# Patient Record
Sex: Female | Born: 1985 | Race: White | Hispanic: No | Marital: Single | Smoking: Current every day smoker
Health system: Southern US, Community
[De-identification: ages and names within clinical notes are randomized; demographics above are authoritative.]

## PROBLEM LIST (undated history)

## (undated) DIAGNOSIS — J069 Acute upper respiratory infection, unspecified: Secondary | ICD-10-CM

## (undated) DIAGNOSIS — J45909 Unspecified asthma, uncomplicated: Secondary | ICD-10-CM

## (undated) HISTORY — DX: Acute upper respiratory infection, unspecified: J06.9

---
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
## 2005-01-11 ENCOUNTER — Encounter (INDEPENDENT_AMBULATORY_CARE_PROVIDER_SITE_OTHER): Payer: Self-pay | Admitting: Internal Medicine

## 2005-01-11 ENCOUNTER — Emergency Department (HOSPITAL_COMMUNITY): Admission: EM | Admit: 2005-01-11 | Discharge: 2005-01-11 | Payer: Self-pay | Admitting: Family Medicine

## 2005-01-11 LAB — CONVERTED CEMR LAB: Pap Smear: NORMAL

## 2005-06-15 ENCOUNTER — Emergency Department (HOSPITAL_COMMUNITY): Admission: EM | Admit: 2005-06-15 | Discharge: 2005-06-15 | Payer: Self-pay | Admitting: Emergency Medicine

## 2005-06-16 ENCOUNTER — Ambulatory Visit: Payer: Self-pay | Admitting: Internal Medicine

## 2006-02-15 ENCOUNTER — Encounter: Payer: Self-pay | Admitting: Internal Medicine

## 2006-02-15 DIAGNOSIS — J45909 Unspecified asthma, uncomplicated: Secondary | ICD-10-CM | POA: Insufficient documentation

## 2006-02-15 DIAGNOSIS — G43909 Migraine, unspecified, not intractable, without status migrainosus: Secondary | ICD-10-CM | POA: Insufficient documentation

## 2007-06-22 ENCOUNTER — Encounter: Admission: RE | Admit: 2007-06-22 | Discharge: 2007-06-22 | Payer: Self-pay | Admitting: Internal Medicine

## 2008-06-13 ENCOUNTER — Emergency Department (HOSPITAL_COMMUNITY): Admission: EM | Admit: 2008-06-13 | Discharge: 2008-06-13 | Payer: Self-pay | Admitting: Emergency Medicine

## 2011-10-20 ENCOUNTER — Emergency Department (HOSPITAL_COMMUNITY)
Admission: EM | Admit: 2011-10-20 | Discharge: 2011-10-20 | Disposition: A | Discharge status: Home / Self Care | Payer: Self-pay | Attending: Emergency Medicine | Admitting: Emergency Medicine

## 2011-10-20 ENCOUNTER — Encounter (HOSPITAL_COMMUNITY): Payer: Self-pay | Admitting: Emergency Medicine

## 2011-10-20 DIAGNOSIS — J45909 Unspecified asthma, uncomplicated: Secondary | ICD-10-CM | POA: Insufficient documentation

## 2011-10-20 DIAGNOSIS — F17200 Nicotine dependence, unspecified, uncomplicated: Secondary | ICD-10-CM | POA: Insufficient documentation

## 2011-10-20 DIAGNOSIS — J029 Acute pharyngitis, unspecified: Secondary | ICD-10-CM | POA: Insufficient documentation

## 2011-10-20 HISTORY — DX: Unspecified asthma, uncomplicated: J45.909

## 2011-10-20 LAB — RAPID STREP SCREEN (MED CTR MEBANE ONLY): Streptococcus, Group A Screen (Direct): NEGATIVE

## 2011-10-20 MED ORDER — PREDNISONE 20 MG PO TABS: 40.0000 mg | ORAL_TABLET | Freq: Every day | ORAL | Status: DC

## 2011-10-20 NOTE — ED Notes (Signed)
Pt reports pain on swallowing, difficulty swallowing x 6 hrs

## 2011-10-20 NOTE — ED Provider Notes (Signed)
History     CSN: [id number]  Arrival date & time 10/20/11  1438   First MD Initiated Contact with Patient 10/20/11 1515      Chief Complaint  Patient presents with  . Sore Throat    6 hr hx of sore throat    (Consider location/radiation/quality/duration/timing/severity/associated sxs/prior treatment) Patient is a 26 y.o. female presenting with pharyngitis. The history is provided by the patient.  Sore Throat This is a new problem. The current episode started today. The problem occurs constantly. The problem has been unchanged. Associated symptoms include a sore throat and swollen glands. Pertinent negatives include no abdominal pain, anorexia, arthralgias, change in bowel habit, chest pain, chills, congestion, coughing, diaphoresis, fatigue, fever, headaches, joint swelling, myalgias, nausea, neck pain, numbness, rash, urinary symptoms, vertigo, visual change, vomiting or weakness. The symptoms are aggravated by swallowing. She has tried nothing for the symptoms.    Past Medical History  Diagnosis Date  . Asthma     History reviewed. No pertinent past surgical history.  History reviewed. No pertinent family history.  History  Substance Use Topics  . Smoking status: Current Every Day Smoker    Types: Cigarettes  . Smokeless tobacco: Not on file  . Alcohol Use: Yes    OB History    Grav Para Term Preterm Abortions TAB SAB Ect Mult Living                  Review of Systems  Constitutional: Negative for fever, chills, diaphoresis, activity change, appetite change and fatigue.  HENT: Positive for sore throat and trouble swallowing. Negative for congestion, facial swelling, rhinorrhea, sneezing, drooling, neck pain, neck stiffness, dental problem, voice change, postnasal drip and sinus pressure.   Eyes: Negative for visual disturbance.  Respiratory: Negative for cough, choking, shortness of breath, wheezing and stridor.   Cardiovascular: Negative for chest pain.    Gastrointestinal: Negative for nausea, vomiting, abdominal pain, anorexia and change in bowel habit.  Musculoskeletal: Negative for myalgias, joint swelling and arthralgias.  Skin: Negative for rash.  Neurological: Negative for dizziness, vertigo, weakness, numbness and headaches.  Hematological: Positive for adenopathy. Does not bruise/bleed easily.  All other systems reviewed and are negative.    Allergies  Review of patient's allergies indicates no known allergies.  Home Medications   Current Outpatient Rx  Name Route Sig Dispense Refill  . NORETHIN ACE-ETH ESTRAD-FE 1.5-30 MG-MCG PO TABS Oral Take 1 tablet by mouth daily.      BP 110/66  Pulse 75  Temp 98.6 F (37 C) (Oral)  Resp 18  SpO2 98%  LMP 09/29/2011  Physical Exam  Nursing note and vitals reviewed. Constitutional: She is oriented to person, place, and time. She appears well-developed and well-nourished. No distress.  HENT:  Head: Normocephalic and atraumatic. No trismus in the jaw.  Right Ear: Tympanic membrane, external ear and ear canal normal.  Left Ear: Tympanic membrane, external ear and ear canal normal.  Nose: Nose normal. No rhinorrhea. Right sinus exhibits no maxillary sinus tenderness and no frontal sinus tenderness. Left sinus exhibits no maxillary sinus tenderness and no frontal sinus tenderness.  Mouth/Throat: Uvula is midline and mucous membranes are normal. Normal dentition. No dental abscesses or uvula swelling. Oropharyngeal exudate and posterior oropharyngeal edema present. No posterior oropharyngeal erythema or tonsillar abscesses.       No submental edema, tongue not elevated, no trismus. No impending airway obstruction; Pt able to speak full sentences, swallow intact, no drooling, stridor, or tonsillar/uvula displacement.  No palatal petechia  Eyes: Conjunctivae normal are normal.  Neck: Trachea normal, normal range of motion and full passive range of motion without pain. Neck supple. No  rigidity. Normal range of motion present. No Brudzinski's sign noted.       Flexion and extension of neck without pain or difficulty. Able to breath without difficulty in extension.  Cardiovascular: Normal rate and regular rhythm.   Pulmonary/Chest: Effort normal and breath sounds normal. No stridor. No respiratory distress. She has no wheezes.  Abdominal: Soft. There is no tenderness.       No obvious evidence of splenomegaly. Non ttp.   Musculoskeletal: Normal range of motion.  Lymphadenopathy:       Head (right side): No preauricular and no posterior auricular adenopathy present.       Head (left side): No preauricular and no posterior auricular adenopathy present.    She has cervical adenopathy.  Neurological: She is alert and oriented to person, place, and time.  Skin: Skin is warm and dry. No rash noted. She is not diaphoretic.  Psychiatric: She has a normal mood and affect.    ED Course  Procedures (including critical care time)   Labs Reviewed  RAPID STREP SCREEN   No results found.   No diagnosis found.    MDM  Pt afebrile with minmal tonsillar exudate & negative strep. Presents with mild cervical lymphadenopathy, & dysphagia; diagnosis of viral pharyngitis. No abx indicated. DC w symptomatic tx for pain  Pt does not appear dehydrated, but did discuss importance of water rehydration. Presentation non concerning for PTA or infxn spread to soft tissue. No trismus or uvula deviation. Specific return precautions discussed. Pt able to drink water in ED without difficulty with intact air way. Recommended PCP follow up.         Jaci Carrel, [place] 10/20/11 [phone]

## 2011-10-20 NOTE — ED Provider Notes (Signed)
Medical screening examination/treatment/procedure(s) were performed by non-physician practitioner and as supervising physician I was immediately available for consultation/collaboration.   Shelda Jakes, MD 10/20/11 [phone]

## 2013-08-16 ENCOUNTER — Encounter (HOSPITAL_COMMUNITY): Payer: Self-pay | Admitting: Emergency Medicine

## 2013-08-16 ENCOUNTER — Emergency Department (HOSPITAL_COMMUNITY)
Admission: EM | Admit: 2013-08-16 | Discharge: 2013-08-16 | Disposition: A | Discharge status: Home / Self Care | Payer: Managed Care, Other (non HMO) | Attending: Emergency Medicine | Admitting: Emergency Medicine

## 2013-08-16 ENCOUNTER — Other Ambulatory Visit: Payer: Self-pay

## 2013-08-16 DIAGNOSIS — F17200 Nicotine dependence, unspecified, uncomplicated: Secondary | ICD-10-CM | POA: Insufficient documentation

## 2013-08-16 DIAGNOSIS — W1809XA Striking against other object with subsequent fall, initial encounter: Secondary | ICD-10-CM | POA: Insufficient documentation

## 2013-08-16 DIAGNOSIS — Y9389 Activity, other specified: Secondary | ICD-10-CM | POA: Insufficient documentation

## 2013-08-16 DIAGNOSIS — Z79899 Other long term (current) drug therapy: Secondary | ICD-10-CM | POA: Insufficient documentation

## 2013-08-16 DIAGNOSIS — Y9289 Other specified places as the place of occurrence of the external cause: Secondary | ICD-10-CM | POA: Insufficient documentation

## 2013-08-16 DIAGNOSIS — J45909 Unspecified asthma, uncomplicated: Secondary | ICD-10-CM | POA: Insufficient documentation

## 2013-08-16 DIAGNOSIS — R55 Syncope and collapse: Secondary | ICD-10-CM | POA: Insufficient documentation

## 2013-08-16 DIAGNOSIS — Z043 Encounter for examination and observation following other accident: Secondary | ICD-10-CM | POA: Insufficient documentation

## 2013-08-16 DIAGNOSIS — IMO0002 Reserved for concepts with insufficient information to code with codable children: Secondary | ICD-10-CM | POA: Insufficient documentation

## 2013-08-16 LAB — CBC WITH DIFFERENTIAL/PLATELET
BASOS PCT: 0 % (ref 0–1)
Basophils Absolute: 0 10*3/uL (ref 0.0–0.1)
EOS ABS: 1.4 10*3/uL — AB (ref 0.0–0.7)
Eosinophils Relative: 13 % — ABNORMAL HIGH (ref 0–5)
HEMATOCRIT: 38.5 % (ref 36.0–46.0)
HEMOGLOBIN: 13.1 g/dL (ref 12.0–15.0)
Lymphocytes Relative: 30 % (ref 12–46)
Lymphs Abs: 3.2 10*3/uL (ref 0.7–4.0)
MCH: 31.1 pg (ref 26.0–34.0)
MCHC: 34 g/dL (ref 30.0–36.0)
MCV: 91.4 fL (ref 78.0–100.0)
MONO ABS: 1.1 10*3/uL — AB (ref 0.1–1.0)
MONOS PCT: 10 % (ref 3–12)
NEUTROS PCT: 47 % (ref 43–77)
Neutro Abs: 5.2 10*3/uL (ref 1.7–7.7)
Platelets: 289 10*3/uL (ref 150–400)
RBC: 4.21 MIL/uL (ref 3.87–5.11)
RDW: 12.7 % (ref 11.5–15.5)
WBC: 10.8 10*3/uL — ABNORMAL HIGH (ref 4.0–10.5)

## 2013-08-16 LAB — BASIC METABOLIC PANEL
Anion gap: 14 (ref 5–15)
BUN: 11 mg/dL (ref 6–23)
CO2: 23 mEq/L (ref 19–32)
CREATININE: 0.52 mg/dL (ref 0.50–1.10)
Calcium: 8.9 mg/dL (ref 8.4–10.5)
Chloride: 103 mEq/L (ref 96–112)
Glucose, Bld: 94 mg/dL (ref 70–99)
Potassium: 4.1 mEq/L (ref 3.7–5.3)
Sodium: 140 mEq/L (ref 137–147)

## 2013-08-16 NOTE — ED Provider Notes (Signed)
CSN: [id number]     Arrival date & time 08/16/13  0023 History   First MD Initiated Contact with Patient 08/16/13 [phone]     Chief Complaint  Patient presents with  . Fall     (Consider location/radiation/quality/duration/timing/severity/associated sxs/prior Treatment) HPI Comments: 28 year old female, history of intermittent syncopal episodes since she was a child presents with a recurrent syncopal episode. She states that her prodromal symptoms include a buzzing in her ear followed by a syncopal episode, there is no palpitations or chest pain, no focal weakness or numbness. Today she fell into a wall striking the left temporoparietal scalp and then collapsing to the ground. This was not witnessed, she believes that her syncopal episode lasted approximately 1 minute followed by complete resolution of her symptoms. At this time she is symptom-free. She denies any recent fevers chills coughing shortness of breath chest pain abdominal pain back pain rashes or swelling has no dysuria or diarrhea. She is currently having her menstrual cycle, denies any bleeding or history of anemia. She doesn't worse having a full workup for syncopal episodes as a child which she states was normal. At this time she has no complaints  Patient is a 28 y.o. female presenting with fall. The history is provided by the patient.  Fall    Past Medical History  Diagnosis Date  . Asthma    History reviewed. No pertinent past surgical history. No family history on file. History  Substance Use Topics  . Smoking status: Current Every Day Smoker    Types: Cigarettes  . Smokeless tobacco: Not on file  . Alcohol Use: Yes   OB History   Grav Para Term Preterm Abortions TAB SAB Ect Mult Living                 Review of Systems  All other systems reviewed and are negative.     Allergies  Review of patient's allergies indicates no known allergies.  Home Medications   Prior to Admission medications   Medication  Sig Start Date End Date Taking? Authorizing Provider  norethindrone-ethinyl estradiol-iron (MICROGESTIN FE,GILDESS FE,LOESTRIN FE) 1.5-30 MG-MCG tablet Take 1 tablet by mouth daily.    Historical Provider, MD  predniSONE (DELTASONE) 20 MG tablet Take 2 tablets (40 mg total) by mouth daily. 10/20/11   Lisette Paz, PA-C   BP 104/53  Pulse 85  Temp(Src) 98.3 F (36.8 C)  Resp 16  Ht 5\' 5"  (1.651 m)  Wt 145 lb (65.772 kg)  BMI 24.13 kg/m2  SpO2 100%  LMP 08/16/2013 Physical Exam  Nursing note and vitals reviewed. Constitutional: She appears well-developed and well-nourished. No distress.  HENT:  Head: Normocephalic and atraumatic.  Mouth/Throat: Oropharynx is clear and moist. No oropharyngeal exudate.  No hematoma noted to the forehead, no hemotympanum, no malocclusion, no raccoon eyes  Eyes: Conjunctivae and EOM are normal. Pupils are equal, round, and reactive to light. Right eye exhibits no discharge. Left eye exhibits no discharge. No scleral icterus.  Neck: Normal range of motion. Neck supple. No JVD present. No thyromegaly present.  Cardiovascular: Normal rate, regular rhythm, normal heart sounds and intact distal pulses.  Exam reveals no gallop and no friction rub.   No murmur heard. Pulmonary/Chest: Effort normal and breath sounds normal. No respiratory distress. She has no wheezes. She has no rales.  Abdominal: Soft. Bowel sounds are normal. She exhibits no distension and no mass. There is no tenderness.  Musculoskeletal: Normal range of motion. She exhibits no edema and no  tenderness.  Lymphadenopathy:    She has no cervical adenopathy.  Neurological: She is alert. Coordination normal.  Normal finger-nose-finger and extremity coordination of all 4 extremities, cranial nerves III through XII intact, normal strength  Skin: Skin is warm and dry. No rash noted. No erythema.  Psychiatric: She has a normal mood and affect. Her behavior is normal.    ED Course  Procedures  (including critical care time) Labs Review Labs Reviewed  CBC WITH DIFFERENTIAL - Abnormal; Notable for the following:    WBC 10.8 (*)    Monocytes Absolute 1.1 (*)    Eosinophils Relative 13 (*)    Eosinophils Absolute 1.4 (*)    All other components within normal limits  BASIC METABOLIC PANEL    Imaging Review No results found.  ED ECG REPORT  I personally interpreted this EKG   Date: 08/16/2013   Rate: 78  Rhythm: normal sinus rhythm  QRS Axis: normal  Intervals: normal  ST/T Wave abnormalities: normal  Conduction Disutrbances:none  Narrative Interpretation:   Old EKG Reviewed: none available   MDM   Final diagnoses:  None    EKG normal, well-appearing, doubt cardiac or significant neurologic source of the patient's symptoms. She can be referred to family doctor for ongoing symptoms and evaluation. The patient is not anemic.      Vida RollerBrian D Rasheda Ledger, MD 08/16/13 [phone]

## 2013-08-16 NOTE — ED Notes (Signed)
The pt has a history of fainting spells since she was a little girl.  Tonight she felt like she was going to faint and got up to go to the br she fell and struck her rt forehead.  She has a mild headache now.  Alert no distress.  She also has a small amount of pain in her lt little finger.  lmp now

## 2013-08-16 NOTE — Discharge Instructions (Signed)
EKG and blood work normal

## 2016-04-08 ENCOUNTER — Other Ambulatory Visit: Payer: Self-pay | Admitting: Family Medicine

## 2016-04-08 DIAGNOSIS — H539 Unspecified visual disturbance: Secondary | ICD-10-CM

## 2016-04-08 DIAGNOSIS — R112 Nausea with vomiting, unspecified: Secondary | ICD-10-CM

## 2016-04-12 ENCOUNTER — Ambulatory Visit
Admission: RE | Admit: 2016-04-12 | Discharge: 2016-04-12 | Disposition: A | Discharge status: Home / Self Care | Payer: 59 | Source: Ambulatory Visit | Attending: Family Medicine | Admitting: Family Medicine

## 2016-04-12 DIAGNOSIS — R112 Nausea with vomiting, unspecified: Secondary | ICD-10-CM

## 2016-04-12 DIAGNOSIS — H539 Unspecified visual disturbance: Secondary | ICD-10-CM

## 2018-02-17 IMAGING — CT CT HEAD W/O CM
3 of 4 series · 16 of 47 positions shown, 19 images · non-contrast
Comparison: None.

CLINICAL DATA: Several episodes of syncope with recent fall.
Dizziness.

EXAM:
CT HEAD WITHOUT CONTRAST
TECHNIQUE: Contiguous axial images were obtained from the base of the skull
through the vertex without intravenous contrast.

[Series 32: 3d filtered head w/o · axial · non-contrast · 0.49mm/px · z∈[+4,+124]mm · 10 of 30 slices shown, 13 images]
[im 3/30  brain]
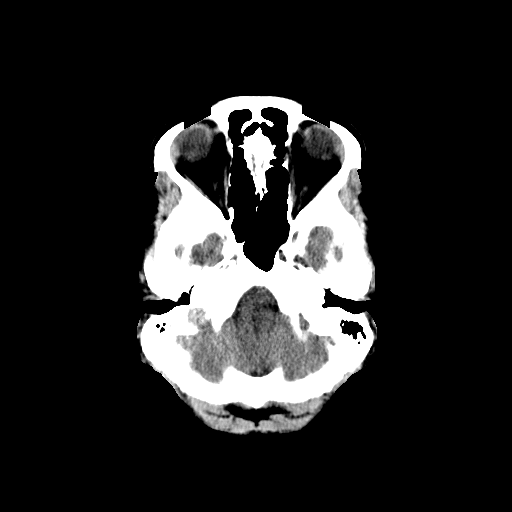
[im 3/30  bone]
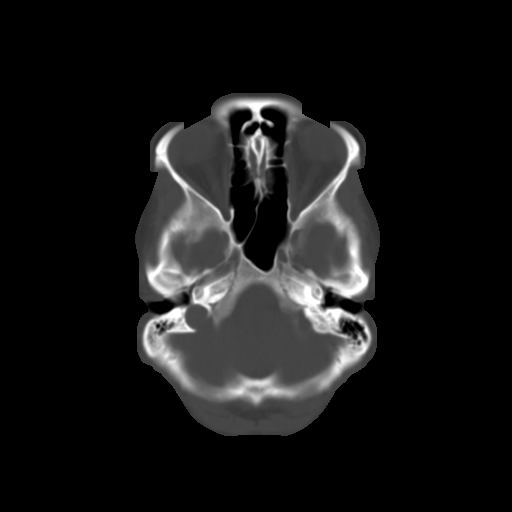
[im 5/30  brain]
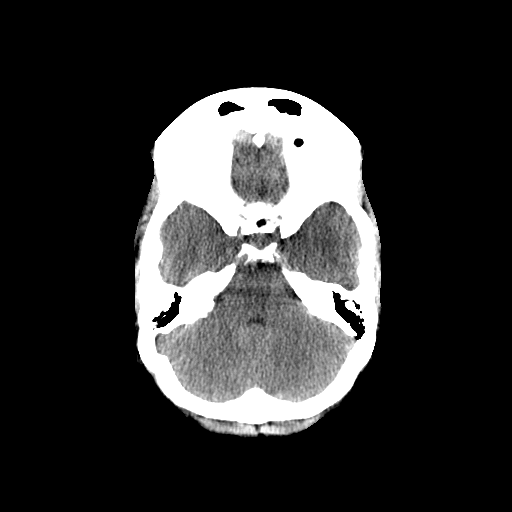
[im 9/30  brain]
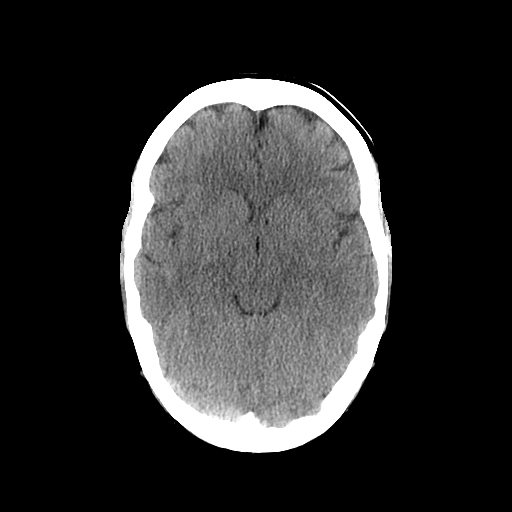
[im 11/30  brain]
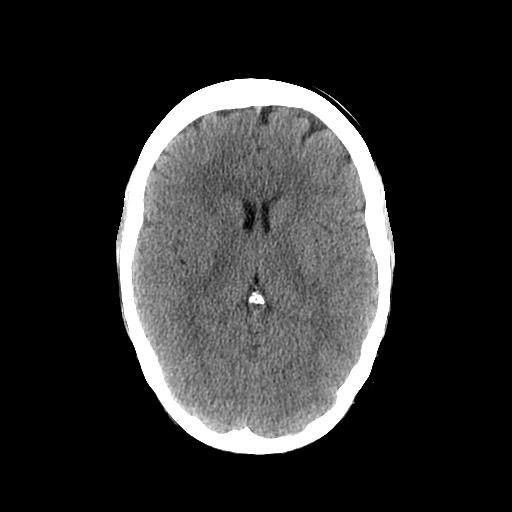
[im 13/30  brain]
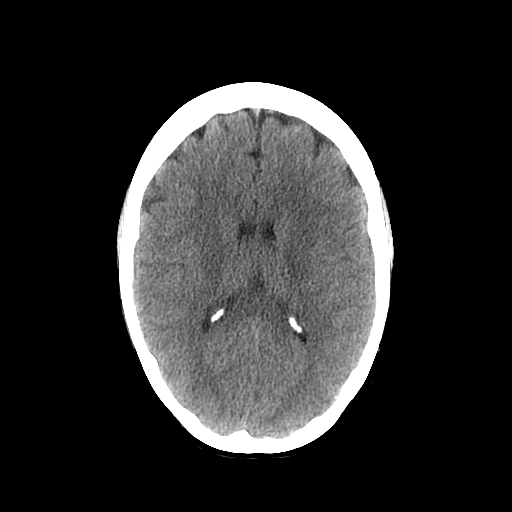
[im 13/30  bone]
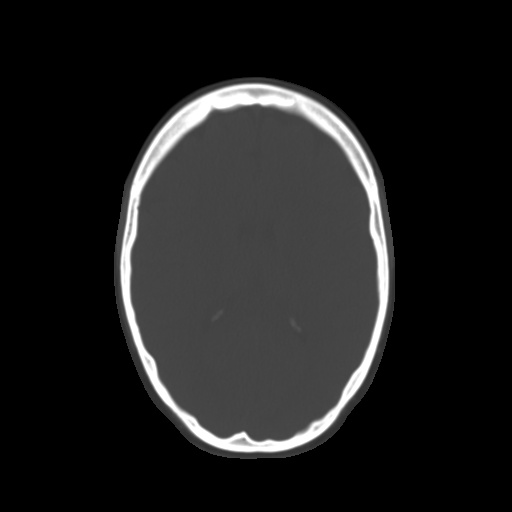
[im 17/30  brain]
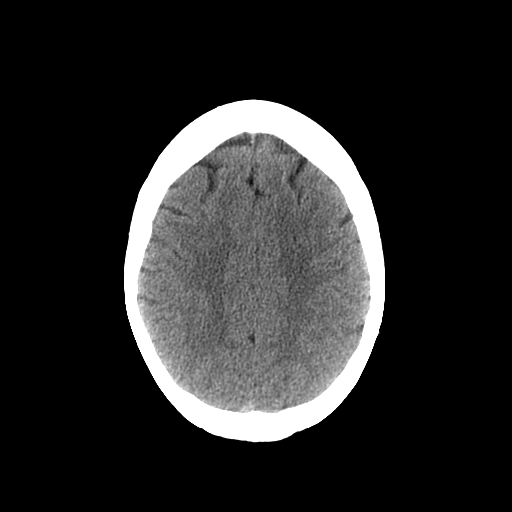
[im 19/30  brain]
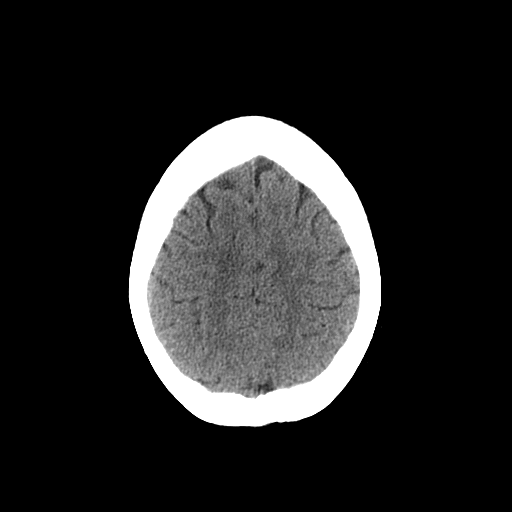
[im 21/30  brain]
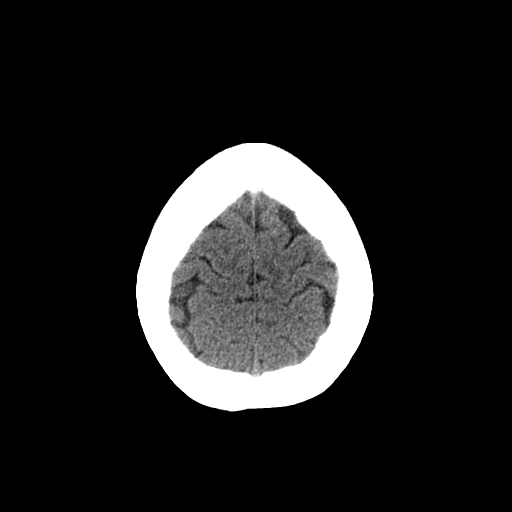
[im 25/30  brain]
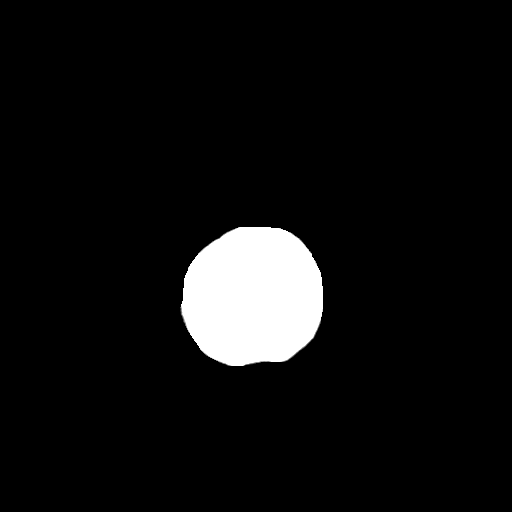
[im 25/30  bone]
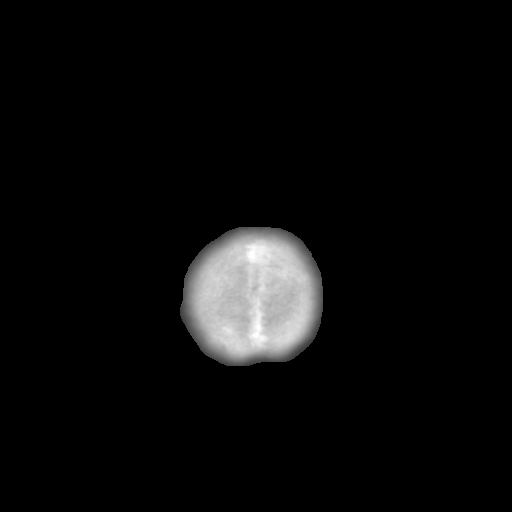
[im 27/30  brain]
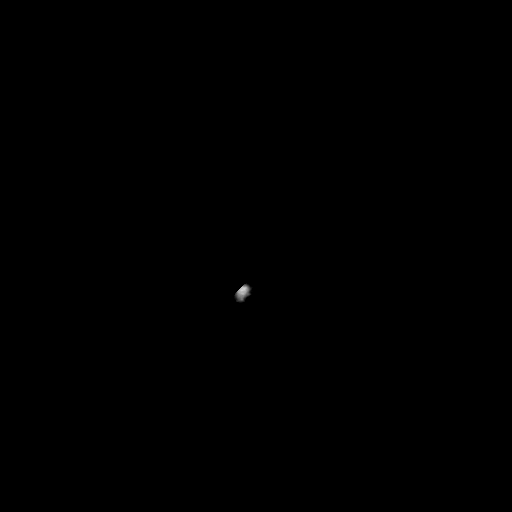

[Series 601: coronal brain · coronal · 0.49mm/px · 3 of 66 slices shown]
[im 22/66  brain]
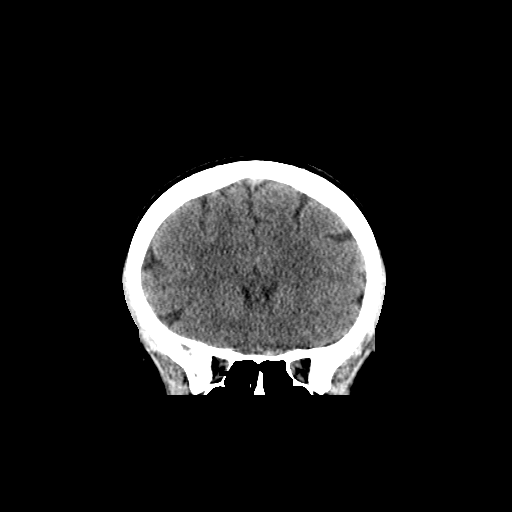
[im 29/66  brain]
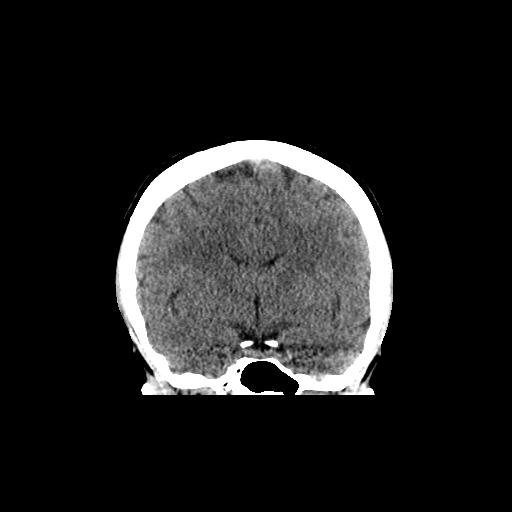
[im 37/66  brain]
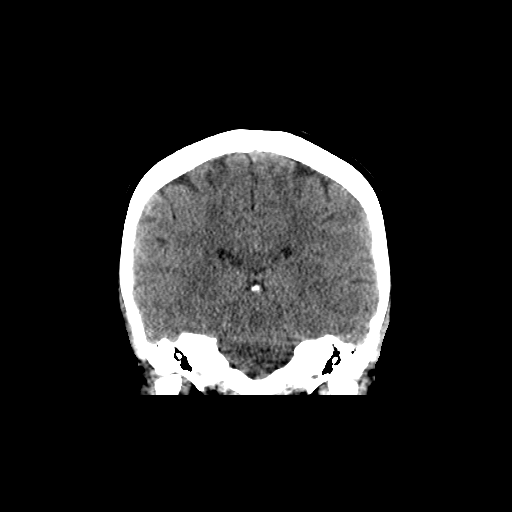

[Series 602: sagittal brain · sagittal · 0.49mm/px · 3 of 50 slices shown]
[im 17/50  brain]
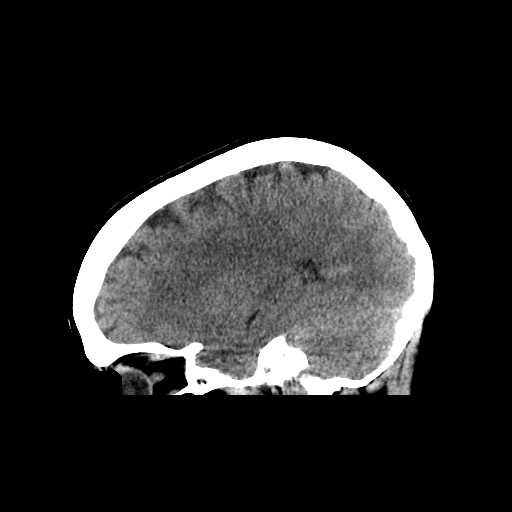
[im 25/50  brain]
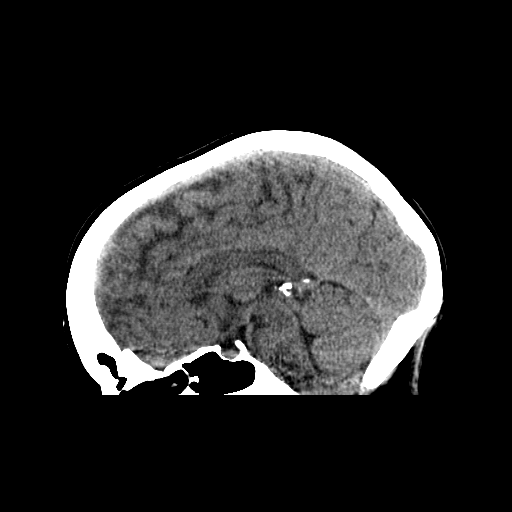
[im 33/50  brain]
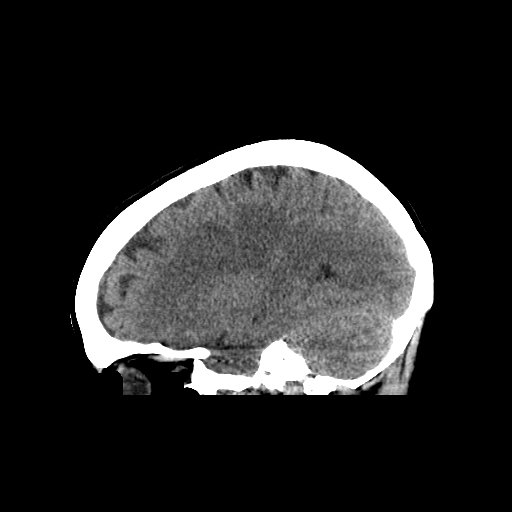

[16 of 47 positions shown; findings below may reference images not displayed]

FINDINGS: Brain: The ventricles are normal in size and configuration. There is
no intracranial mass, hemorrhage, extra-axial fluid collection, or
midline shift. Gray-white compartments appear normal. No acute
infarct evident.

Vascular: No hyperdense vessel. No arterial vascular calcification
evident.

Skull: The bony calvarium appears intact.

Sinuses/Orbits: There is mucosal thickening in the mid ethmoid air
cell region on the right. Other visualized paranasal sinuses are
clear. Visualized orbits appear symmetric bilaterally.

Other: Mastoid air cells are clear.
IMPRESSION: Mild right ethmoid sinus disease. Study otherwise unremarkable. No
intracranial mass, hemorrhage, or extra-axial fluid collection.
Gray-white compartments appear normal.

## 2021-09-18 ENCOUNTER — Ambulatory Visit (INDEPENDENT_AMBULATORY_CARE_PROVIDER_SITE_OTHER): Payer: BC Managed Care – PPO | Admitting: Radiology

## 2021-09-18 ENCOUNTER — Other Ambulatory Visit (HOSPITAL_COMMUNITY)
Admission: RE | Admit: 2021-09-18 | Discharge: 2021-09-18 | Disposition: A | Discharge status: Home / Self Care | Payer: BC Managed Care – PPO | Source: Ambulatory Visit | Attending: Radiology | Admitting: Radiology

## 2021-09-18 ENCOUNTER — Encounter: Payer: Self-pay | Admitting: Radiology

## 2021-09-18 VITALS — BP 102/64 | Ht 65.0 in | Wt 157.0 lb

## 2021-09-18 DIAGNOSIS — N911 Secondary amenorrhea: Secondary | ICD-10-CM

## 2021-09-18 DIAGNOSIS — Z113 Encounter for screening for infections with a predominantly sexual mode of transmission: Secondary | ICD-10-CM | POA: Insufficient documentation

## 2021-09-18 DIAGNOSIS — Z01419 Encounter for gynecological examination (general) (routine) without abnormal findings: Secondary | ICD-10-CM

## 2021-09-18 NOTE — Progress Notes (Signed)
   Kimberly Anthony Aug 31, 1985 [id number]   History:  36 y.o. G0 presents for annual exam as a new patient. Second opion for perimenopause.  Complains of skipping periods, hot flashes and night sweats. LMP: 07/2021, PMP:  02/2020 Had elevated FSH and low AMH 06/2020, normal pelvic u/s with thin endometrial lining.   Gynecologic History No LMP recorded (approximate). Period Pattern: (!) Irregular Contraception/Family planning: condoms Sexually active: no Last Pap: 2020. Results were: normal Last mammogram: 2021. Results were: normal  Obstetric History OB History  Gravida Para Term Preterm AB Living  0 0 0 0 0 0  SAB IAB Ectopic Multiple Live Births  0 0 0 0 0     The following portions of the patient's history were reviewed and updated as appropriate: allergies, current medications, past family history, past medical history, past social history, past surgical history, and problem list.  Review of Systems Pertinent items noted in HPI and remainder of comprehensive ROS otherwise negative.   Past medical history, past surgical history, family history and social history were all reviewed and documented in the EPIC chart.   Exam:  Vitals:   09/18/21 1334  BP: 102/64  Weight: 157 lb (71.2 kg)  Height: 5\' 5"  (1.651 m)   Body mass index is 26.13 kg/m.  General appearance:  Normal Thyroid:  Symmetrical, normal in size, without palpable masses or nodularity. Respiratory  Auscultation:  Clear without wheezing or rhonchi Cardiovascular  Auscultation:  Regular rate, without rubs, murmurs or gallops  Edema/varicosities:  Not grossly evident Abdominal  Soft,nontender, without masses, guarding or rebound.  Liver/spleen:  No organomegaly noted  Hernia:  None appreciated  Skin  Inspection:  Grossly normal Breasts: Examined lying and sitting.   Right: Without masses, retractions, nipple discharge or axillary adenopathy.   Left: Without masses, retractions, nipple discharge or  axillary adenopathy. Genitourinary   Inguinal/mons:  Normal without inguinal adenopathy  External genitalia:  Normal appearing vulva with no masses, tenderness, or lesions  BUS/Urethra/Skene's glands:  Normal without masses or exudate  Vagina:  Normal appearing with normal color and discharge, no lesions  Cervix:  Normal appearing without discharge or lesions  Uterus:  Normal in size, shape and contour.  Mobile, nontender  Adnexa/parametria:     Rt: Normal in size, without masses or tenderness.   Lt: Normal in size, without masses or tenderness.  Anus and perineum: Normal   Patient informed chaperone available to be present for breast and pelvic exam. Patient has requested no chaperone to be present. Patient has been advised what will be completed during breast and pelvic exam.   Assessment/Plan:   1. Well woman exam with routine gynecological exam  - Cytology - PAP( [facility])  2. Amenorrhea, secondary  - FSH - Estradiol - HgB A1c - Prolactin - Thyroid Panel With TSH     Discussed smoking cessation SBE, pap and STI screening as directed/appropriate. Recommend of exercise weekly, including weight bearing exercise. Encouraged the use of seatbelts and sunscreen. Return in 1 year for annual or as needed.   Rolena Knutson B WHNP-BC 2:10 PM 09/18/2021

## 2021-09-19 LAB — THYROID PANEL WITH TSH
Free Thyroxine Index: 2 (ref 1.4–3.8)
T3 Uptake: 31 % (ref 22–35)
T4, Total: 6.5 ug/dL (ref 5.1–11.9)
TSH: 0.92 mIU/L

## 2021-09-19 LAB — HEMOGLOBIN A1C
Hgb A1c MFr Bld: 5.2 % of total Hgb (ref <5.7)
Mean Plasma Glucose: 103 mg/dL
eAG (mmol/L): 5.7 mmol/L

## 2021-09-19 LAB — ESTRADIOL: Estradiol: 33 pg/mL

## 2021-09-19 LAB — PROLACTIN: Prolactin: 2.7 ng/mL

## 2021-09-19 LAB — FOLLICLE STIMULATING HORMONE: FSH: 100.3 m[IU]/mL

## 2021-09-25 LAB — CYTOLOGY - PAP
Adequacy: ABSENT
Chlamydia: NEGATIVE
Comment: NEGATIVE
Comment: NEGATIVE
Comment: NEGATIVE
Comment: NORMAL
Diagnosis: UNDETERMINED — AB
High risk HPV: NEGATIVE
Neisseria Gonorrhea: NEGATIVE
Trichomonas: NEGATIVE

## 2022-03-16 NOTE — Progress Notes (Unsigned)
New Patient Note  RE: Breilyn Anthony MRN: [id number] DOB: 11/20/1985 Date of Office Visit: 03/17/2022  Consult requested by: Verdell Carmine., MD Primary care provider: Verdell Carmine., MD  Chief Complaint: No chief complaint on file.  History of Present Illness: I had the pleasure of seeing Kimberly Anthony for initial evaluation at the [facility] [place] on 03/16/2022. She is a 37 y.o. female, who is referred here by Verdell Carmine., MD for the evaluation of chronic sinusitis and asthma.  She reports symptoms of ***. Symptoms have been going on for *** years. The symptoms are present *** all year around with worsening in ***. Other triggers include exposure to ***. Anosmia: ***. Headache: ***. She has used *** with ***fair improvement in symptoms. Sinus infections: ***. Previous work up includes: ***. Previous ENT evaluation: ***. Previous sinus imaging: ***. History of nasal polyps: ***. Last eye exam: ***. History of reflux: ***.  She reports symptoms of *** chest tightness, shortness of breath, coughing, wheezing, nocturnal awakenings for *** years. Current medications include *** which help. She reports *** using aerochamber with inhalers. She tried the following inhalers: ***. Main triggers are ***allergies, infections, weather changes, smoke, exercise, pet exposure. In the last month, frequency of symptoms: ***x/week. Frequency of nocturnal symptoms: ***x/month. Frequency of SABA use: ***x/week. Interference with physical activity: ***. Sleep is ***disturbed. In the last 12 months, emergency room visits/urgent care visits/doctor office visits or hospitalizations due to respiratory issues: ***. In the last 12 months, oral steroids courses: ***. Lifetime history of hospitalization for respiratory issues: ***. Prior intubations: ***. Asthma was diagnosed at age *** by ***. History of pneumonia: ***. She was evaluated by allergist ***pulmonologist in the past. Smoking exposure:  ***. Up to date with flu vaccine: ***. Up to date with pneumonia vaccine: ***. Up to date with COVID-19 vaccine: ***. Prior Covid-19 infection: ***. History of reflux: ***.  Assessment and Plan: Quinita is a 37 y.o. female with: No problem-specific Assessment & Plan notes found for this encounter.  No follow-ups on file.  No orders of the defined types were placed in this encounter.  Lab Orders  No laboratory test(s) ordered today    Other allergy screening: Asthma: {Blank single:19197::"yes","no"} Rhino conjunctivitis: {Blank single:19197::"yes","no"} Food allergy: {Blank single:19197::"yes","no"} Medication allergy: {Blank single:19197::"yes","no"} Hymenoptera allergy: {Blank single:19197::"yes","no"} Urticaria: {Blank single:19197::"yes","no"} Eczema:{Blank single:19197::"yes","no"} History of recurrent infections suggestive of immunodeficency: {Blank single:19197::"yes","no"}  Diagnostics: Spirometry:  Tracings reviewed. Her effort: {Blank single:19197::"Good reproducible efforts.","It was hard to get consistent efforts and there is a question as to whether this reflects a maximal maneuver.","Poor effort, data can not be interpreted."} FVC: ***L FEV1: ***L, ***% predicted FEV1/FVC ratio: ***% Interpretation: {Blank single:19197::"Spirometry consistent with mild obstructive disease","Spirometry consistent with moderate obstructive disease","Spirometry consistent with severe obstructive disease","Spirometry consistent with possible restrictive disease","Spirometry consistent with mixed obstructive and restrictive disease","Spirometry uninterpretable due to technique","Spirometry consistent with normal pattern","No overt abnormalities noted given today's efforts"}.  Please see scanned spirometry results for details.  Skin Testing: {Blank single:19197::"Select foods","Environmental allergy panel","Environmental allergy panel and select foods","Food allergy panel","None","Deferred due  to recent antihistamines use"}. *** Results discussed with patient/family.   Past Medical History: Patient Active Problem List   Diagnosis Date Noted  . MIGRAINE HEADACHE 02/15/2006  . ASTHMA 02/15/2006   Past Medical History:  Diagnosis Date  . Asthma    Past Surgical History: No past surgical history on file. Medication List:  Current Outpatient Medications  Medication Sig Dispense Refill  . buPROPion (  WELLBUTRIN XL) 150 MG 24 hr tablet Take 150 mg by mouth daily.    . cetirizine (ZYRTEC) 10 MG tablet Take 1 tablet by mouth daily.    . sertraline (ZOLOFT) 50 MG tablet Take 50 mg by mouth daily.     No current facility-administered medications for this visit.   Allergies: No Known Allergies Social History: Social History   Socioeconomic History  . Marital status: Single    Spouse name: Not on file  . Number of children: Not on file  . Years of education: Not on file  . Highest education level: Not on file  Occupational History  . Not on file  Tobacco Use  . Smoking status: Every Day    Packs/day: 1.00    Types: Cigarettes  . Smokeless tobacco: Never  Substance and Sexual Activity  . Alcohol use: Yes    Comment: ocasionally  . Drug use: Never  . Sexual activity: Yes    Partners: Male    Birth control/protection: Condom    Comment: 1st intercourse 37yo  Other Topics Concern  . Not on file  Social History Narrative  . Not on file   Social Determinants of Health   Financial Resource Strain: Not on file  Food Insecurity: Not on file  Transportation Needs: Not on file  Physical Activity: Not on file  Stress: Not on file  Social Connections: Not on file   Lives in a ***. Smoking: *** Occupation: ***  Environmental HistoryFreight forwarder in the house: Estate agent in the family room: {Blank single:19197::"yes","no"} Carpet in the bedroom: {Blank single:19197::"yes","no"} Heating: {Blank  single:19197::"electric","gas","heat pump"} Cooling: {Blank single:19197::"central","window","heat pump"} Pet: {Blank single:19197::"yes ***","no"}  Family History: Family History  Problem Relation Age of Onset  . Sleep apnea Mother   . Kidney disease Father   . Sleep apnea Father    Problem                               Relation Asthma                                   *** Eczema                                *** Food allergy                          *** Allergic rhino conjunctivitis     ***  Review of Systems  Constitutional:  Negative for appetite change, chills, fever and unexpected weight change.  HENT:  Negative for congestion and rhinorrhea.   Eyes:  Negative for itching.  Respiratory:  Negative for cough, chest tightness, shortness of breath and wheezing.   Cardiovascular:  Negative for chest pain.  Gastrointestinal:  Negative for abdominal pain.  Genitourinary:  Negative for difficulty urinating.  Skin:  Negative for rash.  Neurological:  Negative for headaches.   Objective: There were no vitals taken for this visit. There is no height or weight on file to calculate BMI. Physical Exam Vitals and nursing note reviewed.  Constitutional:      Appearance: Normal appearance. She is well-developed.  HENT:     Head: Normocephalic and atraumatic.     Right Ear: Tympanic membrane and external ear normal.  Left Ear: Tympanic membrane and external ear normal.     Nose: Nose normal.     Mouth/Throat:     Mouth: Mucous membranes are moist.     Pharynx: Oropharynx is clear.  Eyes:     Conjunctiva/sclera: Conjunctivae normal.  Cardiovascular:     Rate and Rhythm: Normal rate and regular rhythm.     Heart sounds: Normal heart sounds. No murmur heard.    No friction rub. No gallop.  Pulmonary:     Effort: Pulmonary effort is normal.     Breath sounds: Normal breath sounds. No wheezing, rhonchi or rales.  Musculoskeletal:     Cervical back: Neck supple.  Skin:     General: Skin is warm.     Findings: No rash.  Neurological:     Mental Status: She is alert and oriented to person, place, and time.  Psychiatric:        Behavior: Behavior normal.  The plan was reviewed with the patient/family, and all questions/concerned were addressed.  It was my pleasure to see Brittannie today and participate in her care. Please feel free to contact me with any questions or concerns.  Sincerely,  Rexene Alberts, DO Allergy & Immunology  [facility] office: [place] office: [phone]

## 2022-03-17 ENCOUNTER — Telehealth: Payer: Self-pay

## 2022-03-17 ENCOUNTER — Other Ambulatory Visit: Payer: Self-pay

## 2022-03-17 ENCOUNTER — Ambulatory Visit (INDEPENDENT_AMBULATORY_CARE_PROVIDER_SITE_OTHER): Payer: BC Managed Care – PPO | Admitting: Allergy

## 2022-03-17 ENCOUNTER — Encounter: Payer: Self-pay | Admitting: Allergy

## 2022-03-17 VITALS — BP 120/86 | HR 88 | Temp 98.4°F | Resp 18 | Ht 65.0 in | Wt 158.5 lb

## 2022-03-17 DIAGNOSIS — L2089 Other atopic dermatitis: Secondary | ICD-10-CM

## 2022-03-17 DIAGNOSIS — J3089 Other allergic rhinitis: Secondary | ICD-10-CM | POA: Diagnosis not present

## 2022-03-17 DIAGNOSIS — J4540 Moderate persistent asthma, uncomplicated: Secondary | ICD-10-CM | POA: Diagnosis not present

## 2022-03-17 DIAGNOSIS — B999 Unspecified infectious disease: Secondary | ICD-10-CM | POA: Diagnosis not present

## 2022-03-17 MED ORDER — FLUTICASONE FUROATE-VILANTEROL 100-25 MCG/ACT IN AEPB: 1.0000 | INHALATION_SPRAY | Freq: Every day | RESPIRATORY_TRACT | 3 refills | Status: DC

## 2022-03-17 MED ORDER — RYALTRIS 665-25 MCG/ACT NA SUSP: 1.0000 | Freq: Two times a day (BID) | NASAL | 5 refills | Status: AC

## 2022-03-17 NOTE — Assessment & Plan Note (Signed)
Diagnosed with asthma as a child and it was quiescent up until recently.  Has been needing to use rescue inhaler and nebulizer more often lately.  2 courses of steroids the past year.  Quit smoking last month.  Concerned about allergic triggers including the mold at her work environment. Denies reflux. Today's skin testing showed: Positive to grass, ragweed, weed, trees, mold, dust mites, cat and dog. Daily controller medication(s): start Breo 172mg 1 puff once a day and rinse mouth after each use. Demonstrated proper use.  Let me know if not covered.   May use albuterol rescue inhaler 2 puffs or nebulizer every 4 to 6 hours as needed for shortness of breath, chest tightness, coughing, and wheezing. May use albuterol rescue inhaler 2 puffs 5 to 15 minutes prior to strenuous physical activities. Monitor frequency of use.  Get spirometry at next visit. Letter written and mailed to patient regarding allergies and asthma for her employer.

## 2022-03-17 NOTE — Assessment & Plan Note (Signed)
Frequent sinus infections the past year. Keep track of infections and antibiotics use. If still persistent then will get bloodwork next.

## 2022-03-17 NOTE — Assessment & Plan Note (Signed)
Perennial rhinoconjunctivitis symptoms for the last 3 years.  Tried Zyrtec with some benefit.  Frequent sinus infections.  No prior allergy workup.  Concerned about mold at work triggering her symptoms. Today's skin testing showed: Positive to grass, ragweed, weed, trees, mold, dust mites, cat and dog. Start environmental control measures as below. Start Ryaltris (olopatadine + mometasone nasal spray combination) 1-2 sprays per nostril twice a day. Sample given. This replaces your other nasal sprays. If this works well for you, then have [facility] ship the medication to your home - prescription already sent in.  Nasal saline spray (i.e., Simply Saline) or nasal saline lavage (i.e., NeilMed) is recommended as needed and prior to medicated nasal sprays. Use over the counter antihistamines such as Zyrtec (cetirizine), Claritin (loratadine), Allegra (fexofenadine), or Xyzal (levocetirizine) daily as needed. May take twice a day during allergy flares. May switch antihistamines every few months. May use over the counter eye drops - refresh.  Consider allergy injections for long term control if above medications do not help the symptoms - handout given.

## 2022-03-17 NOTE — Assessment & Plan Note (Signed)
Continue proper skin care.

## 2022-03-17 NOTE — Patient Instructions (Addendum)
Today's skin testing showed: Positive to grass, ragweed, weed, trees, mold, dust mites, cat and dog.  Results given.  Asthma Daily controller medication(s): start Breo 148mg 1 puff once a day and rinse mouth after each use. Demonstrated proper use.  Let me know if not covered.   May use albuterol rescue inhaler 2 puffs or nebulizer every 4 to 6 hours as needed for shortness of breath, chest tightness, coughing, and wheezing. May use albuterol rescue inhaler 2 puffs 5 to 15 minutes prior to strenuous physical activities. Monitor frequency of use.  Breathing control goals:  Full participation in all desired activities (may need albuterol before activity) Albuterol use two times or less a week on average (not counting use with activity) Cough interfering with sleep two times or less a month Oral steroids no more than once a year No hospitalizations   Environmental allergies Start environmental control measures as below. Start Ryaltris (olopatadine + mometasone nasal spray combination) 1-2 sprays per nostril twice a day. Sample given. This replaces your other nasal sprays. If this works well for you, then have [facility] ship the medication to your home - prescription already sent in.  Nasal saline spray (i.e., Simply Saline) or nasal saline lavage (i.e., NeilMed) is recommended as needed and prior to medicated nasal sprays. Use over the counter antihistamines such as Zyrtec (cetirizine), Claritin (loratadine), Allegra (fexofenadine), or Xyzal (levocetirizine) daily as needed. May take twice a day during allergy flares. May switch antihistamines every few months. May use over the counter eye drops - refresh.  Consider allergy injections for long term control if above medications do not help the symptoms - handout given.   Infections Keep track of infections and antibiotics use. If still persistent then will get bloodwork next.  Follow up in 2 months or sooner if needed.    Will mail letter  later this week.   Reducing Pollen Exposure Pollen seasons: trees (spring), grass (summer) and ragweed/weeds (fall). Keep windows closed in your home and car to lower pollen exposure.  Install air conditioning in the bedroom and throughout the house if possible.  Avoid going out in dry windy days - especially early morning. Pollen counts are highest between 5 - 10 AM and on dry, hot and windy days.  Save outside activities for late afternoon or after a heavy rain, when pollen levels are lower.  Avoid mowing of grass if you have grass pollen allergy. Be aware that pollen can also be transported indoors on people and pets.  Dry your clothes in an automatic dryer rather than hanging them outside where they might collect pollen.  Rinse hair and eyes before bedtime. Mold Control Mold and fungi can grow on a variety of surfaces provided certain temperature and moisture conditions exist.  Outdoor molds grow on plants, decaying vegetation and soil. The major outdoor mold, Alternaria and Cladosporium, are found in very high numbers during hot and dry conditions. Generally, a late summer - fall peak is seen for common outdoor fungal spores. Rain will temporarily lower outdoor mold spore count, but counts rise rapidly when the rainy period ends. The most important indoor molds are Aspergillus and Penicillium. Dark, humid and poorly ventilated basements are ideal sites for mold growth. The next most common sites of mold growth are the bathroom and the kitchen. Outdoor (Seasonal) Mold Control Use air conditioning and keep windows closed. Avoid exposure to decaying vegetation. Avoid leaf raking. Avoid grain handling. Consider wearing a face mask if working in moldy areas.  Indoor (Perennial) Mold Control  Maintain humidity below 50%. Get rid of mold growth on hard surfaces with water, detergent and, if necessary, 5% bleach (do not mix with other cleaners). Then dry the area completely. If mold covers an  area more than 10 square feet, consider hiring an indoor environmental professional. For clothing, washing with soap and water is best. If moldy items cannot be cleaned and dried, throw them away. Remove sources e.g. contaminated carpets. Repair and seal leaking roofs or pipes. Using dehumidifiers in damp basements may be helpful, but empty the water and clean units regularly to prevent mildew from forming. All rooms, especially basements, bathrooms and kitchens, require ventilation and cleaning to deter mold and mildew growth. Avoid carpeting on concrete or damp floors, and storing items in damp areas. Control of House Dust Mite Allergen Dust mite allergens are a common trigger of allergy and asthma symptoms. While they can be found throughout the house, these microscopic creatures thrive in warm, humid environments such as bedding, upholstered furniture and carpeting. Because so much time is spent in the bedroom, it is essential to reduce mite levels there.  Encase pillows, mattresses, and box springs in special allergen-proof fabric covers or airtight, zippered plastic covers.  Bedding should be washed weekly in hot water (130 F) and dried in a hot dryer. Allergen-proof covers are available for comforters and pillows that can't be regularly washed.  Wash the allergy-proof covers every few months. Minimize clutter in the bedroom. Keep pets out of the bedroom.  Keep humidity less than 50% by using a dehumidifier or air conditioning. You can buy a humidity measuring device called a hygrometer to monitor this.  If possible, replace carpets with hardwood, linoleum, or washable area rugs. If that's not possible, vacuum frequently with a vacuum that has a HEPA filter. Remove all upholstered furniture and non-washable window drapes from the bedroom. Remove all non-washable stuffed toys from the bedroom.  Wash stuffed toys weekly. Pet Allergen Avoidance: Contrary to popular opinion, there are no  "hypoallergenic" breeds of dogs or cats. That is because people are not allergic to an animal's hair, but to an allergen found in the animal's saliva, dander (dead skin flakes) or urine. Pet allergy symptoms typically occur within minutes. For some people, symptoms can build up and become most severe 8 to 12 hours after contact with the animal. People with severe allergies can experience reactions in public places if dander has been transported on the pet owners' clothing. Keeping an animal outdoors is only a partial solution, since homes with pets in the yard still have higher concentrations of animal allergens. Before getting a pet, ask your allergist to determine if you are allergic to animals. If your pet is already considered part of your family, try to minimize contact and keep the pet out of the bedroom and other rooms where you spend a great deal of time. As with dust mites, vacuum carpets often or replace carpet with a hardwood floor, tile or linoleum. High-efficiency particulate air (HEPA) cleaners can reduce allergen levels over time. While dander and saliva are the source of cat and dog allergens, urine is the source of allergens from rabbits, hamsters, mice and [place] pigs; so ask a non-allergic family member to clean the animal's cage. If you have a pet allergy, talk to your allergist about the potential for allergy immunotherapy (allergy shots). This strategy can often provide long-term relief.  Buffered Isotonic Saline Irrigations:  Goal: When you irrigate with the isotonic saline (  salt water) it washes mucous and other debris from your nose that could be contributing to your nasal symptoms.   Recipe: Obtain 1 quart jar that is clean Fill with clean (bottled, boiled or distilled) water Add 1-2 heaping teaspoons of salt without iodine If the solution with 2 teaspoons of salt is too strong, adjust the amount down until better tolerated Add 1 teaspoon of [employer] baking soda (pure  bicarbonate) Mix ingredients together and store at room temperature and discard after 1 week * Alternatively you can buy pre made salt packets for the NeilMed bottle or there          are other over the counter brands available  Instructions: Warm  cup of the solution in the microwave if desired but be careful not to overheat as this will burn the inside of your nose Stand over a sink (or do it while you shower) and squirt the solution into one side of your nose aiming towards the back of your head Sometimes saying "coca cola" while irrigating can be helpful to prevent fluid from going down your throat  The solution will travel to the back of your nose and then come out the other side Perform this again on the other side Try to do this twice a day If you are using a nasal spray in addition to the irrigation, irrigate first and then use the topical nasal spray otherwise you will wash the nasal spray out of your nose

## 2022-03-17 NOTE — Telephone Encounter (Signed)
Letter has been mailed out for patient's mold allergy. I called the patient and left a message to inform the patient.

## 2022-03-21 ENCOUNTER — Encounter: Payer: Self-pay | Admitting: Allergy

## 2022-04-02 ENCOUNTER — Encounter: Payer: Self-pay | Admitting: Allergy

## 2022-04-02 MED ORDER — BUDESONIDE-FORMOTEROL FUMARATE 80-4.5 MCG/ACT IN AERO: 2.0000 | INHALATION_SPRAY | Freq: Two times a day (BID) | RESPIRATORY_TRACT | 3 refills | Status: AC

## 2022-05-17 ENCOUNTER — Ambulatory Visit: Payer: BC Managed Care – PPO | Admitting: Allergy
